# Patient Record
Sex: Male | Born: 1980 | Race: White | Hispanic: No | Marital: Married | Smoking: Never smoker
Health system: Southern US, Community
[De-identification: ages and names within clinical notes are randomized; demographics above are authoritative.]

---

## 2017-03-01 DIAGNOSIS — Z23 Encounter for immunization: Secondary | ICD-10-CM | POA: Diagnosis not present

## 2017-05-27 DIAGNOSIS — Z0000 Encounter for general adult medical examination without abnormal findings: Secondary | ICD-10-CM | POA: Diagnosis not present

## 2017-06-02 DIAGNOSIS — Z13220 Encounter for screening for lipoid disorders: Secondary | ICD-10-CM | POA: Diagnosis not present

## 2017-06-02 DIAGNOSIS — R5383 Other fatigue: Secondary | ICD-10-CM | POA: Diagnosis not present

## 2017-09-22 DIAGNOSIS — E559 Vitamin D deficiency, unspecified: Secondary | ICD-10-CM | POA: Diagnosis not present

## 2021-08-12 ENCOUNTER — Other Ambulatory Visit: Payer: Self-pay

## 2021-08-12 ENCOUNTER — Emergency Department (HOSPITAL_BASED_OUTPATIENT_CLINIC_OR_DEPARTMENT_OTHER): Payer: Self-pay | Admitting: Radiology

## 2021-08-12 ENCOUNTER — Encounter (HOSPITAL_BASED_OUTPATIENT_CLINIC_OR_DEPARTMENT_OTHER): Payer: Self-pay

## 2021-08-12 ENCOUNTER — Emergency Department (HOSPITAL_BASED_OUTPATIENT_CLINIC_OR_DEPARTMENT_OTHER)
Admission: EM | Admit: 2021-08-12 | Discharge: 2021-08-12 | Disposition: A | Discharge status: Home / Self Care | Payer: Self-pay | Attending: Emergency Medicine | Admitting: Emergency Medicine

## 2021-08-12 DIAGNOSIS — Z03821 Encounter for observation for suspected ingested foreign body ruled out: Secondary | ICD-10-CM | POA: Insufficient documentation

## 2021-08-12 NOTE — ED Provider Notes (Signed)
?[facility] EMERGENCY DEPT ?Provider Note ? ? ?CSN: [id number] ?Arrival date & time: 08/12/21  1410 ? ?  ? ?History ? ?Chief Complaint  ?Patient presents with  ? Swallowed Foreign Body  ? ? ?Mark Guerra is a 41 y.o. male. ? ?Pt is a 41 yo male with no significant pmhx.  He said he thinks he swallowed a piece of metal.  Pt said he was eating a sandwich and felt a crunch.  He looked at the tin can he used for the sandwich and there was a small piece of metal missing from the can.  Pt denies any pain.  He is just worried he did something bad to himself. ? ? ?  ? ?Home Medications ?Prior to Admission medications   ?Not on File  ?   ? ?Allergies    ?Patient has no allergy information on record.   ? ?Review of Systems   ?Review of Systems  ?All other systems reviewed and are negative. ? ?Physical Exam ?Updated Vital Signs ?BP 117/82 (BP Location: Right Arm)   Pulse 87   Temp 99.5 ?F (37.5 ?C)   Resp 18   Ht 5\' 11"  (1.803 m)   Wt 79.4 kg   SpO2 100%   BMI 24.41 kg/m?  ?Physical Exam ?Vitals and nursing note reviewed.  ?Constitutional:   ?   Appearance: Normal appearance.  ?HENT:  ?   Head: Normocephalic and atraumatic.  ?   Right Ear: External ear normal.  ?   Left Ear: External ear normal.  ?   Nose: Nose normal.  ?   Mouth/Throat:  ?   Mouth: Mucous membranes are moist.  ?   Pharynx: Oropharynx is clear.  ?Eyes:  ?   Extraocular Movements: Extraocular movements intact.  ?   Conjunctiva/sclera: Conjunctivae normal.  ?   Pupils: Pupils are equal, round, and reactive to light.  ?Cardiovascular:  ?   Rate and Rhythm: Normal rate and regular rhythm.  ?   Pulses: Normal pulses.  ?   Heart sounds: Normal heart sounds.  ?Pulmonary:  ?   Effort: Pulmonary effort is normal.  ?   Breath sounds: Normal breath sounds.  ?Abdominal:  ?   General: Abdomen is flat. Bowel sounds are normal.  ?Musculoskeletal:  ?   Cervical back: Normal range of motion and neck supple.  ?Neurological:  ?   Mental Status: He is  alert.  ? ? ?ED Results / Procedures / Treatments   ?Labs ?(all labs ordered are listed, but only abnormal results are displayed) ?Labs Reviewed - No data to display ? ?EKG ?None ? ?Radiology ?DG Abdomen 1 View ? ?Result Date: 08/12/2021 ?CLINICAL DATA:  Evaluate for metallic foreign body EXAM: ABDOMEN - 1 VIEW COMPARISON:  None. FINDINGS: Bowel gas pattern is not specific. There are no radiopaque foreign bodies. Visualized lower lung fields are clear. Lower pelvis is not included in its entirety. IMPRESSION: No radiopaque foreign body is seen. Bowel gas pattern is nonspecific. Electronically Signed   By: 08/14/2021 M.D.   On: 08/12/2021 16:09   ? ?Procedures ?Procedures  ? ? ?Medications Ordered in ED ?Medications - No data to display ? ?ED Course/ Medical Decision Making/ A&P ?  ?                        ?Medical Decision Making ?Amount and/or Complexity of Data Reviewed ?Radiology: ordered. ? ? ?This patient presents to the ED for concern of fb  ingestion, this involves an extensive number of treatment options, and is a complaint that carries with it a high risk of complications and morbidity.  The differential diagnosis includes ingestion vs not ingestion ? ? ?Co morbidities that complicate the patient evaluation ? ?none ? ? ?Additional history obtained: ? ?Additional history obtained from epic chart review ? ?Imaging Studies ordered: ? ?I ordered imaging studies including kub  ?I independently visualized and interpreted imaging which showed  ?  ?IMPRESSION:  ?No radiopaque foreign body is seen. Bowel gas pattern is  ?nonspecific.  ? ?I agree with the radiologist interpretation ? ? ? ?Problem List / ED Course: ? ?Possible fb ingestion:  no evidence of metal in kub.  He has no pain.  He is stable for d/c. ? ?Social Determinants of Health: ? ?Lives at home ? ? ?Dispostion: ? ?After consideration of the diagnostic results and the patients response to treatment, I feel that the patent would benefit from  discharge with outpatient f/u.   ? ? ? ? ? ? ? ?Final Clinical Impression(s) / ED Diagnoses ?Final diagnoses:  ?Suspected ingested foreign body not found after observation  ? ? ?Rx / DC Orders ?ED Discharge Orders   ? ? None  ? ?  ? ? ?  ?Jacalyn Lefevre, MD ?08/12/21 1720 ? ?

## 2021-08-12 NOTE — ED Notes (Signed)
Patient given discharge instructions. Questions were answered. Patient verbalized understanding of discharge instructions and care at home.  

## 2021-08-12 NOTE — ED Triage Notes (Signed)
Patient here POV from Home with Possible FB Ingestion. ? ?Patient was eating a Sandwich when he felt a "crunch". Patient then noticed a small 1 cm piece of Tin Can missing from [employer] Occurred approximately at 1200.  ? ?Patient endorses No Pain at the Moment or Previously. No Anticoagulants.  ? ?NAD Noted during Triage. A&Ox4. GCS 15. Ambulatory.  ?

## 2023-04-16 IMAGING — DX DG ABDOMEN 1V
3 series · 3 of 3 positions shown · non-contrast
Comparison: None.

CLINICAL DATA: Evaluate for metallic foreign body

EXAM:
ABDOMEN - 1 VIEW

[abdomen kub (1 of 3)]
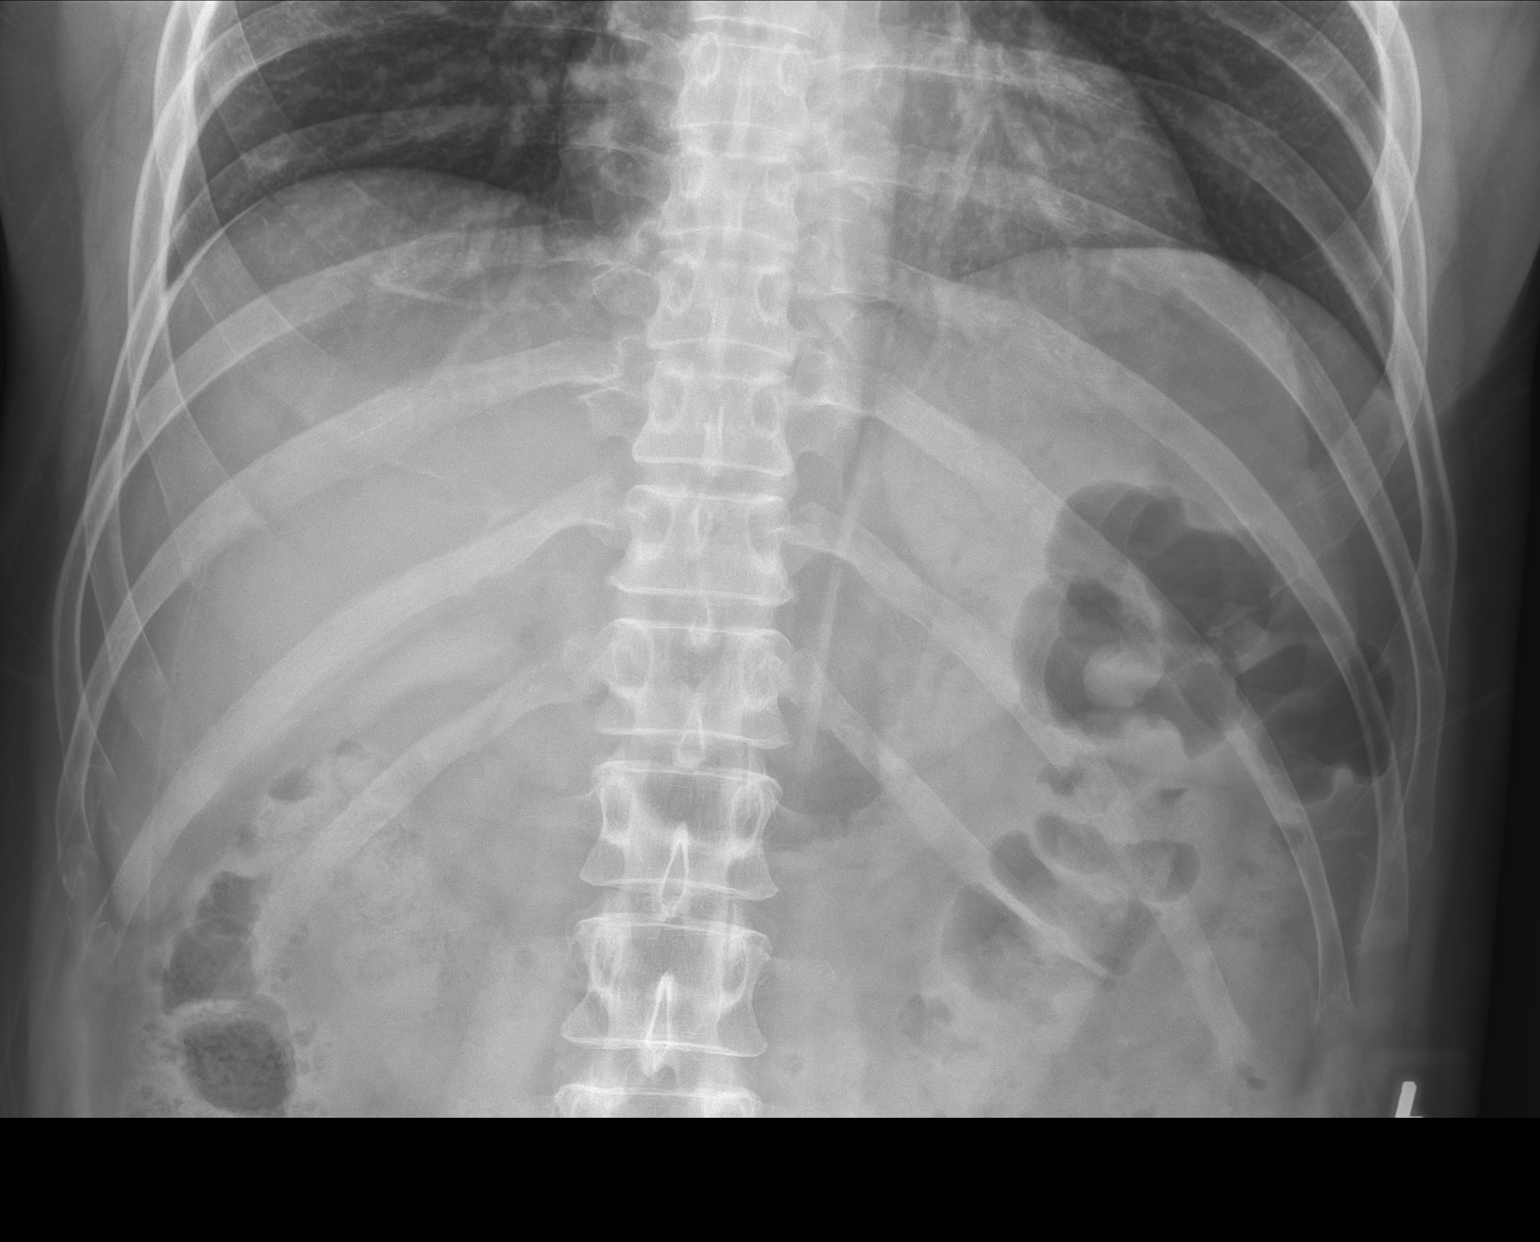

[abdomen kub (2 of 3)]
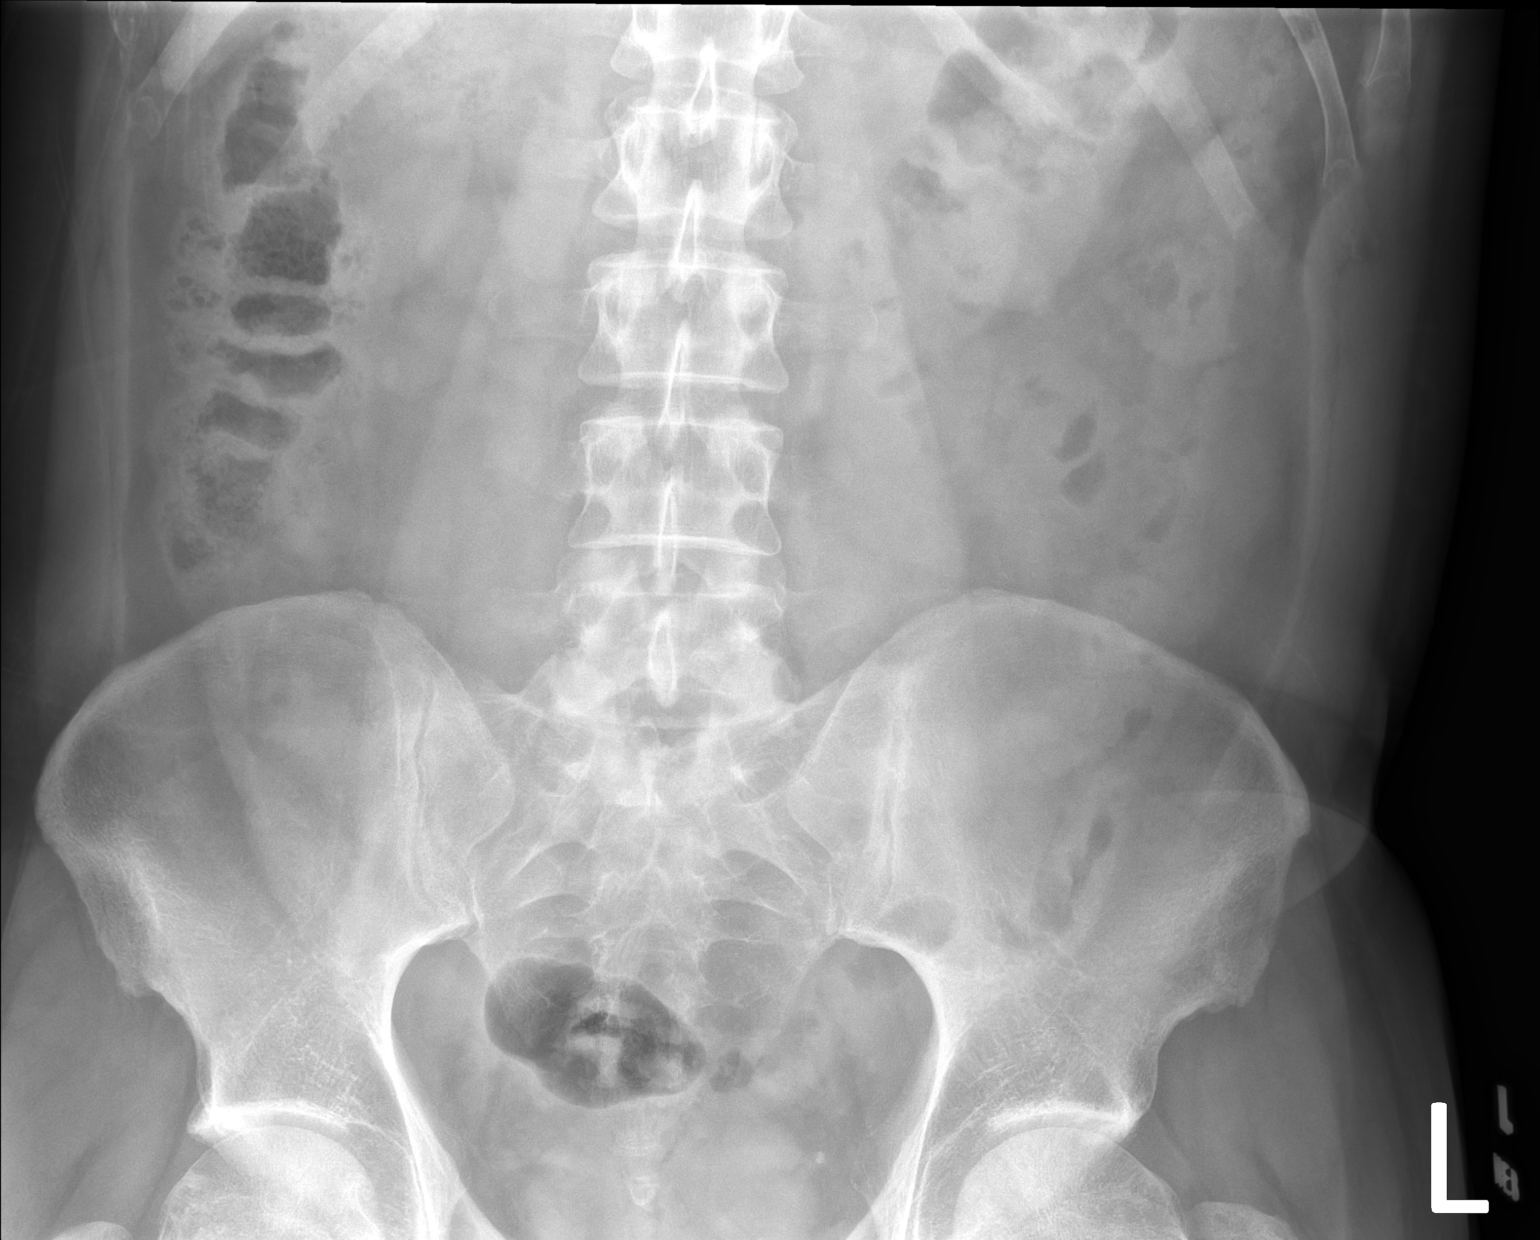

[abdomen kub (3 of 3)]
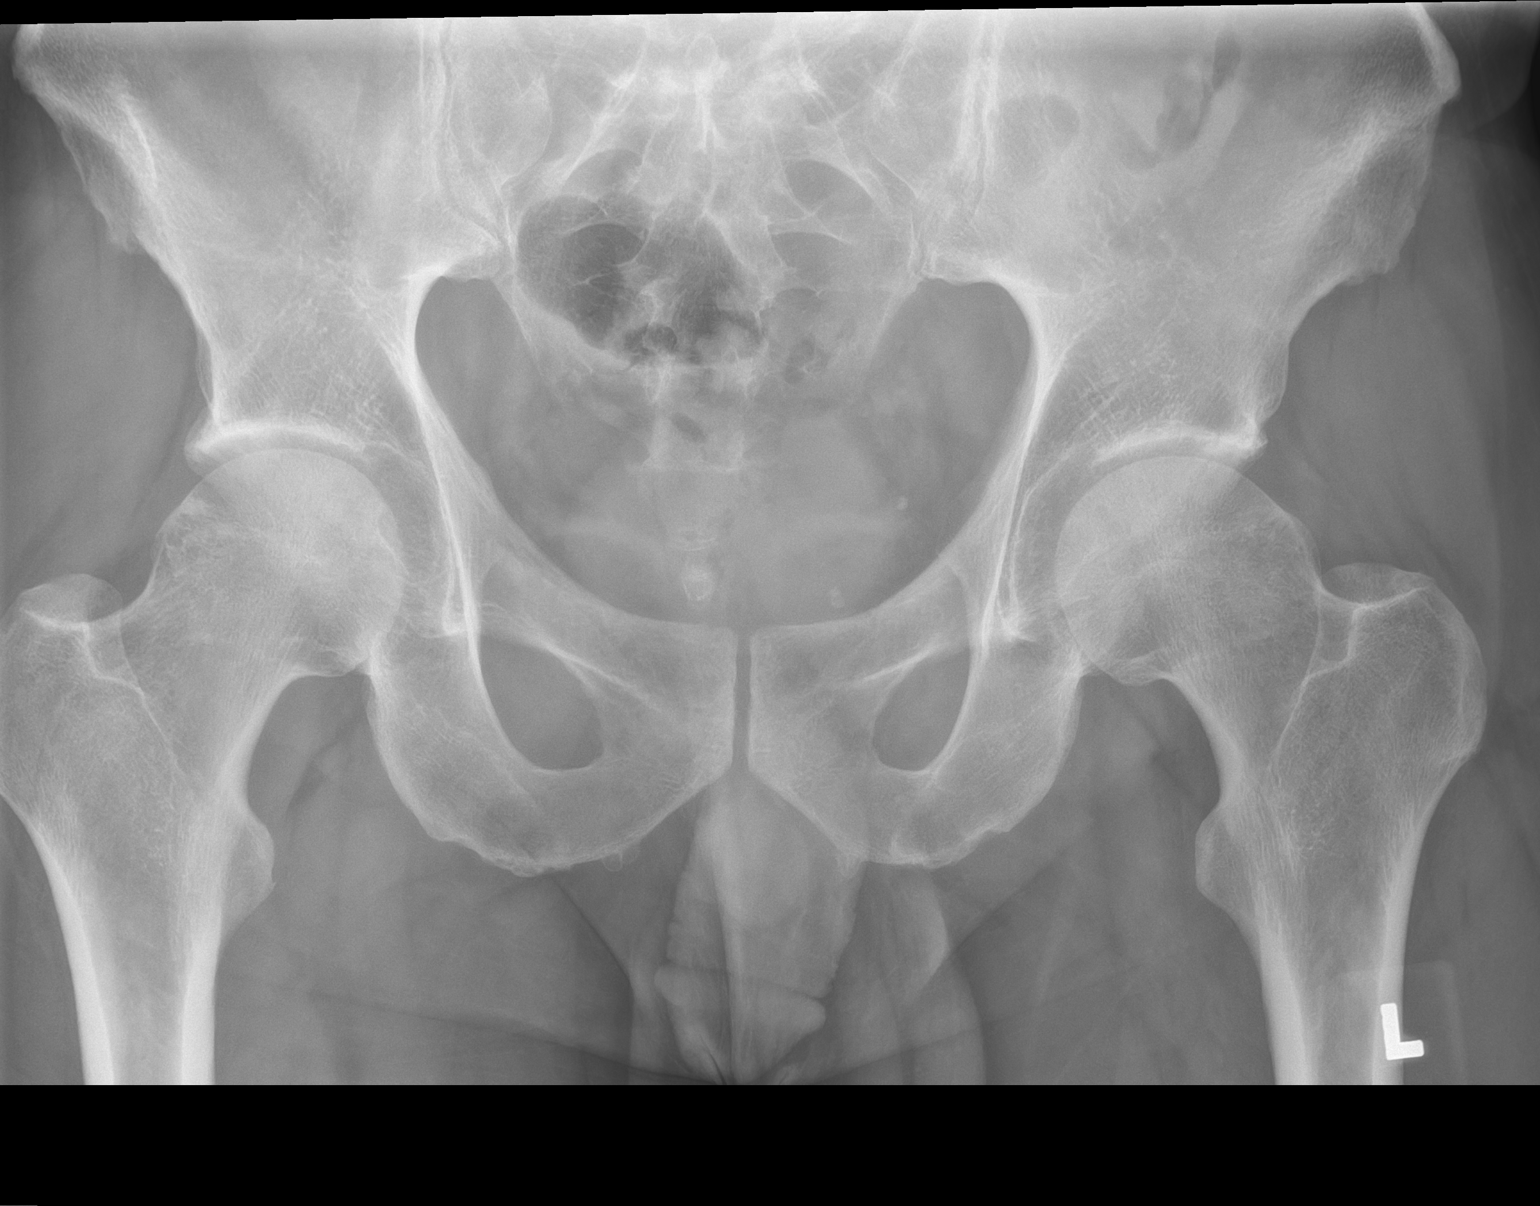

[3 of 3 positions shown; findings below may reference images not displayed]

FINDINGS: Bowel gas pattern is not specific. There are no radiopaque foreign
bodies. Visualized lower lung fields are clear. Lower pelvis is not
included in its entirety.
IMPRESSION: No radiopaque foreign body is seen. Bowel gas pattern is
nonspecific.

## 2023-12-29 ENCOUNTER — Emergency Department (HOSPITAL_BASED_OUTPATIENT_CLINIC_OR_DEPARTMENT_OTHER)
Admission: EM | Admit: 2023-12-29 | Discharge: 2023-12-29 | Disposition: A | Discharge status: Home / Self Care | Attending: Emergency Medicine | Admitting: Emergency Medicine

## 2023-12-29 ENCOUNTER — Encounter (HOSPITAL_BASED_OUTPATIENT_CLINIC_OR_DEPARTMENT_OTHER): Payer: Self-pay | Admitting: Emergency Medicine

## 2023-12-29 ENCOUNTER — Other Ambulatory Visit (HOSPITAL_BASED_OUTPATIENT_CLINIC_OR_DEPARTMENT_OTHER): Payer: Self-pay

## 2023-12-29 ENCOUNTER — Emergency Department (HOSPITAL_BASED_OUTPATIENT_CLINIC_OR_DEPARTMENT_OTHER)

## 2023-12-29 ENCOUNTER — Other Ambulatory Visit: Payer: Self-pay

## 2023-12-29 DIAGNOSIS — R1031 Right lower quadrant pain: Secondary | ICD-10-CM | POA: Diagnosis present

## 2023-12-29 DIAGNOSIS — N201 Calculus of ureter: Secondary | ICD-10-CM | POA: Insufficient documentation

## 2023-12-29 LAB — URINALYSIS, ROUTINE W REFLEX MICROSCOPIC
Bilirubin Urine: NEGATIVE
Glucose, UA: NEGATIVE mg/dL
Ketones, ur: NEGATIVE mg/dL
Leukocytes,Ua: NEGATIVE
Nitrite: NEGATIVE
Protein, ur: NEGATIVE mg/dL
RBC / HPF: >50 RBC/hpf (ref 0–5)
Specific Gravity, Urine: 1.013 (ref 1.005–1.030)
pH: 5.5 (ref 5.0–8.0)

## 2023-12-29 LAB — CBC WITH DIFFERENTIAL/PLATELET
Abs Immature Granulocytes: 0.02 K/uL (ref 0.00–0.07)
Basophils Absolute: 0 K/uL (ref 0.0–0.1)
Basophils Relative: 0 %
Eosinophils Absolute: 0.1 K/uL (ref 0.0–0.5)
Eosinophils Relative: 1 %
HCT: 47.9 % (ref 39.0–52.0)
Hemoglobin: 16 g/dL (ref 13.0–17.0)
Immature Granulocytes: 0 %
Lymphocytes Relative: 22 %
Lymphs Abs: 1.5 K/uL (ref 0.7–4.0)
MCH: 28.9 pg (ref 26.0–34.0)
MCHC: 33.4 g/dL (ref 30.0–36.0)
MCV: 86.5 fL (ref 80.0–100.0)
Monocytes Absolute: 0.5 K/uL (ref 0.1–1.0)
Monocytes Relative: 7 %
Neutro Abs: 4.7 K/uL (ref 1.7–7.7)
Neutrophils Relative %: 70 %
Platelets: 240 K/uL (ref 150–400)
RBC: 5.54 MIL/uL (ref 4.22–5.81)
RDW: 12.4 % (ref 11.5–15.5)
WBC: 6.7 K/uL (ref 4.0–10.5)
nRBC: 0 % (ref 0.0–0.2)

## 2023-12-29 LAB — COMPREHENSIVE METABOLIC PANEL WITH GFR
ALT: 30 U/L (ref 0–44)
AST: 23 U/L (ref 15–41)
Albumin: 4.9 g/dL (ref 3.5–5.0)
Alkaline Phosphatase: 58 U/L (ref 38–126)
Anion gap: 13 (ref 5–15)
BUN: 12 mg/dL (ref 6–20)
CO2: 25 mmol/L (ref 22–32)
Calcium: 10.4 mg/dL — ABNORMAL HIGH (ref 8.9–10.3)
Chloride: 101 mmol/L (ref 98–111)
Creatinine, Ser: 1.1 mg/dL (ref 0.61–1.24)
GFR, Estimated: >60 mL/min (ref >60)
Glucose, Bld: 99 mg/dL (ref 70–99)
Potassium: 4.3 mmol/L (ref 3.5–5.1)
Sodium: 139 mmol/L (ref 135–145)
Total Bilirubin: 0.8 mg/dL (ref 0.0–1.2)
Total Protein: 7.9 g/dL (ref 6.5–8.1)

## 2023-12-29 LAB — LIPASE, BLOOD: Lipase: 38 U/L (ref 11–51)

## 2023-12-29 MED ORDER — MORPHINE SULFATE (PF) 4 MG/ML IV SOLN
4.0000 mg | INTRAVENOUS | Status: DC | PRN
  Filled 2023-12-29: qty 1

## 2023-12-29 MED ORDER — LACTATED RINGERS IV BOLUS
1000.0000 mL | Freq: Once | INTRAVENOUS | Status: AC
  Administered 2023-12-29: 1000 mL via INTRAVENOUS

## 2023-12-29 MED ORDER — IOHEXOL 300 MG/ML  SOLN
100.0000 mL | Freq: Once | INTRAMUSCULAR | Status: AC | PRN
  Administered 2023-12-29: 100 mL via INTRAVENOUS

## 2023-12-29 MED ORDER — ONDANSETRON 4 MG PO TBDP
4.0000 mg | ORAL_TABLET | Freq: Three times a day (TID) | ORAL | 0 refills | Status: AC | PRN
Start: 2023-12-29 — End: ?
  Filled 2023-12-29: qty 20, 7d supply, fill #0

## 2023-12-29 MED ORDER — OXYCODONE-ACETAMINOPHEN 5-325 MG PO TABS
1.0000 | ORAL_TABLET | Freq: Four times a day (QID) | ORAL | 0 refills | Status: AC | PRN
  Filled 2023-12-29: qty 15, 4d supply, fill #0

## 2023-12-29 MED ORDER — ONDANSETRON HCL 4 MG/2ML IJ SOLN
4.0000 mg | INTRAMUSCULAR | Status: DC | PRN
  Filled 2023-12-29: qty 2

## 2023-12-29 MED ORDER — TAMSULOSIN HCL 0.4 MG PO CAPS
0.4000 mg | ORAL_CAPSULE | Freq: Every day | ORAL | 0 refills | Status: AC
  Filled 2023-12-29: qty 15, 15d supply, fill #0

## 2023-12-29 NOTE — Discharge Instructions (Addendum)
 You were seen in the ER today for concerns of abdominal pain. Your labs were reassuring and your CT imaging shows that you have a right sided kidney stone in the ureter. There is no evidence of infection with this stone at this time. We have sent your urine off to culture to assess for any possible discrete infection. I am starting you on Percocet, Zofran , and Flomax  for symptom control. Please take these as prescribed. Follow up with urology for further evaluation to ensure stone has passed or for further management if stone is not passing on its own.

## 2023-12-29 NOTE — ED Triage Notes (Signed)
 Pt caox4, NAD c/o RLQ abd pain x1 wk with N/V/D, pain worsens after eating. Denies PMH abd surgeries.

## 2023-12-29 NOTE — ED Provider Notes (Signed)
 [place] [facility] DEPARTMENT AT [facility] Provider Note   CSN: [id number] Arrival date & time: 12/29/23  9078     Patient presents with: Abdominal Pain   Mark Guerra is a 43 y.o. male. Patient without significant medical history presents to the ED with concerns of abdominal pain.  Patient reports that he is experiencing right lower quadrant pain for the last week with associated nausea, vomiting, and looser stools.  He does report poor oral intake in the last several days due to the symptoms has been having mostly liquid diet bowel movements have somewhat slowed down.  No reported fevers but has felt hot on a few occasions.  Denies any sick contacts.  Currently feels that pain is minimal and does not have any nausea as he does not any food on his stomach.   Abdominal Pain Associated symptoms: nausea        Prior to Admission medications   Medication Sig Start Date End Date Taking? Authorizing Provider  ondansetron  (ZOFRAN -ODT) 4 MG disintegrating tablet Take 1 tablet (4 mg total) by mouth every 8 (eight) hours as needed for nausea or vomiting. 12/29/23  Yes Alandis Bluemel A, PA-C  oxyCODONE -acetaminophen  (PERCOCET/ROXICET) 5-325 MG tablet Take 1 tablet by mouth every 6 (six) hours as needed for severe pain (pain score 7-10). 12/29/23  Yes Aylee Littrell, [place] [facility], PA-C  tamsulosin  (FLOMAX ) 0.4 MG CAPS capsule Take 1 capsule (0.4 mg total) by mouth at bedtime for 15 days. 12/29/23 01/13/24 Yes Sebastiano Luecke A, PA-C    Allergies: Patient has no known allergies.    Review of Systems  Gastrointestinal:  Positive for abdominal pain and nausea.  All other systems reviewed and are negative.   Updated Vital Signs BP 124/88   Pulse 91   Temp 98.5 F (36.9 C)   Resp 15   Ht 5' 11 (1.803 m)   Wt 83.9 kg   SpO2 100%   BMI 25.80 kg/m   Physical Exam Vitals and nursing note reviewed.  Constitutional:      General: He is not in acute distress.    Appearance: He is  well-developed.  HENT:     Head: Normocephalic and atraumatic.  Eyes:     Conjunctiva/sclera: Conjunctivae normal.  Cardiovascular:     Rate and Rhythm: Normal rate and regular rhythm.     Heart sounds: No murmur heard. Pulmonary:     Effort: Pulmonary effort is normal. No respiratory distress.     Breath sounds: Normal breath sounds.  Abdominal:     Palpations: Abdomen is soft.     Tenderness: There is abdominal tenderness in the right lower quadrant. There is no right CVA tenderness, left CVA tenderness, guarding or rebound.  Musculoskeletal:        General: No swelling.     Cervical back: Neck supple.  Skin:    General: Skin is warm and dry.     Capillary Refill: Capillary refill takes less than 2 seconds.  Neurological:     Mental Status: He is alert.  Psychiatric:        Mood and Affect: Mood normal.     (all labs ordered are listed, but only abnormal results are displayed) Labs Reviewed  COMPREHENSIVE METABOLIC PANEL WITH GFR - Abnormal; Notable for the following components:      Result Value   Calcium 10.4 (*)    All other components within normal limits  URINALYSIS, ROUTINE W REFLEX MICROSCOPIC - Abnormal; Notable for the following components:  Hgb urine dipstick LARGE (*)    Bacteria, UA RARE (*)    All other components within normal limits  URINE CULTURE  LIPASE, BLOOD  CBC WITH DIFFERENTIAL/PLATELET    EKG: None  Radiology: CT ABDOMEN PELVIS W CONTRAST Result Date: 12/29/2023 CLINICAL DATA:  RLQ abdominal pain. EXAM: CT ABDOMEN AND PELVIS WITH CONTRAST TECHNIQUE: Multidetector CT imaging of the abdomen and pelvis was performed using the standard protocol following bolus administration of intravenous contrast. RADIATION DOSE REDUCTION: This exam was performed according to the departmental dose-optimization program which includes automated exposure control, adjustment of the mA and/or kV according to patient size and/or use of iterative reconstruction  technique. CONTRAST:  OMNIPAQUE  IOHEXOL  300 MG/ML  SOLN COMPARISON:  None Available. FINDINGS: Lower chest: The lung bases are clear. No pleural effusion. The heart is normal in size. No pericardial effusion. Hepatobiliary: The liver is normal in size. Non-cirrhotic configuration. No suspicious mass. These is mild diffuse hepatic steatosis. No intrahepatic or extrahepatic bile duct dilation. No calcified gallstones. Normal gallbladder wall thickness. No pericholecystic inflammatory changes. Pancreas: Unremarkable. No pancreatic ductal dilatation or surrounding inflammatory changes. Spleen: Within normal limits. No focal lesion. Adrenals/Urinary Tract: Adrenal glands are unremarkable. No suspicious renal mass. There is a 3 x 3 x 4 mm right upper ureteric calculus causing mild-to-moderate proximal obstructive uropathy. Right ureter distal to the calculus is unremarkable. No other nephroureterolithiasis on either side. No left obstructive uropathy. Urinary bladder is under distended, precluding optimal assessment. However, no large mass or stones identified. No perivesical fat stranding. Stomach/Bowel: There is a small sliding hiatal hernia. No disproportionate dilation of the small or large bowel loops. No evidence of abnormal bowel wall thickening or inflammatory changes. The appendix is unremarkable. Vascular/Lymphatic: No ascites or pneumoperitoneum. No abdominal or pelvic lymphadenopathy, by size criteria. No aneurysmal dilation of the major abdominal arteries. Reproductive: Normal size prostate. Symmetric seminal vesicles. Other: There is a tiny fat containing umbilical hernia. The soft tissues and abdominal wall are otherwise unremarkable. Musculoskeletal: No suspicious osseous lesions. IMPRESSION: 1. There is a 3 x 3 x 4 mm right upper ureteric calculus causing mild-to-moderate proximal obstructive uropathy. No other nephroureterolithiasis on either side. No left obstructive uropathy. 2. Multiple other  nonacute observations, as described above. Electronically Signed   By: Ree Molt M.D.   On: 12/29/2023 11:21     Procedures   Medications Ordered in the ED  ondansetron  (ZOFRAN ) injection 4 mg (0 mg Intravenous Hold 12/29/23 0955)  morphine  (PF) 4 MG/ML injection 4 mg (0 mg Intravenous Hold 12/29/23 0955)  lactated ringers  bolus 1,000 mL (0 mLs Intravenous Stopped 12/29/23 1102)  iohexol  (OMNIPAQUE ) 300 MG/ML solution 100 mL (100 mLs Intravenous Contrast Given 12/29/23 1108)                                    Medical Decision Making Amount and/or Complexity of Data Reviewed Labs: ordered. Radiology: ordered.  Risk Prescription drug management.   This patient presents to the ED for concern of abdominal pain, this involves an extensive number of treatment options, and is a complaint that carries with it a high risk of complications and morbidity.  The differential diagnosis includes no obstruction, diverticulitis, appendicitis, acute cystitis, pyelonephritis   Co morbidities that complicate the patient evaluation  None   Lab Tests:  I Ordered, and personally interpreted labs.  The pertinent results include: CBC unremarkable, CMP normal with  exception of slight hypercalcemia 10.4 likely secondary to mild dehydration but no evident AKI on CMP, lipase normal at 38, UA large hemoglobin present in similar bacteria but no leukocytes or nitrates   Imaging Studies ordered:  I ordered imaging studies including CT abdomen pelvis I independently visualized and interpreted imaging which showed there is a 3 x 3 x 4 mm right upper ureteric calculus causing mild-to-moderate proximal obstructive uropathy. No other nephroureterolithiasis on either side. No left obstructive uropathy. Multiple other non-acute observations. I agree with the radiologist interpretation   Consultations Obtained:  I requested consultation with none,  and discussed lab and imaging findings as well as pertinent plan  - they recommend: N/A   Problem List / ED Course / Critical interventions / Medication management  Patient without significant history presents the emergency department concern of abdominal pain.  He reports that he has been experiencing right lower quadrant abdominal pain for the last week.  States the pain typically worsens after eating.  Most recently, he ate last night a banana around 9 PM and had pain again about 1 hour later.  States that he then began to vomit at around 1 AM.  Multiple episodes of vomiting.  Denies hematemesis, medic easier, melanotic stools.  Concern for dehydration given that he is having hard time tolerating p.o. although he was able to successfully tolerate liquids leading up to his symptoms exacerbating last night. On exam, patient is well-appearing with stable vitals.  He does have some focal right lower quadrant tenderness but no guarding seen.  No tenderness to palpation in the epigastrium and the right upper quadrant.  Reports pain is currently 3-4 out of 10 and manageable.  Does not feel he needs any pain medications.  Denies any nausea at this time. Orders placed for pain and nausea medications here in the emergency department.  Will proceed with basic labs and CT imaging to rule out appendicitis versus diverticulitis versus pyelonephritis versus other. Patient's lab workup is largely reassuring.  Urinalysis does show large hemoglobin present, concern for urolithiasis but no obvious signs of infection with no nitrites or leukocytes seen.  Rare bacteria seen so sent for culture..  Otherwise unremarkable labs. CT abdomen pelvis is concerning for a 3 x 3 x 4 mm right upper ureteric calculus causing mild to moderate proximal obstructive uropathy.  With no obvious infected stone, hold off antibiotics.  Culture needed at this time.  On reevaluation, patient reports symptoms are remaining stable. Patient has findings, I believe the patient stable for outpatient follow-up.  Will  send off urine for culture and send patient home with a combination of Percocet for pain, Zofran  for nausea, Flomax , and advise close follow-up with urology. Stable for outpatient follow up and discharged home. I ordered medication including fluids  for vomiting  Reevaluation of the patient after these medicines showed that the patient improved I have reviewed the patients home medicines and have made adjustments as needed   Social Determinants of Health:  None   Test / Admission - Considered:  Admission considered but stable for outpatient follow up.  Final diagnoses:  Calculus of ureter    ED Discharge Orders          Ordered    oxyCODONE -acetaminophen  (PERCOCET/ROXICET) 5-325 MG tablet  Every 6 hours PRN        12/29/23 1152    ondansetron  (ZOFRAN -ODT) 4 MG disintegrating tablet  Every 8 hours PRN        12/29/23 1152  tamsulosin  (FLOMAX ) 0.4 MG CAPS capsule  Daily at bedtime        12/29/23 1152               Elif Yonts A, PA-C 12/29/23 1156    Dreama Longs, MD 12/29/23 2218

## 2023-12-30 LAB — URINE CULTURE
Culture: NO GROWTH
Special Requests: NORMAL
